# Patient Record
Sex: Female | Born: 1985 | Race: Black or African American | Hispanic: No | Marital: Single | Smoking: Current every day smoker
Health system: Southern US, Community
[De-identification: ages and names within clinical notes are randomized; demographics above are authoritative.]

## PROBLEM LIST (undated history)

## (undated) DIAGNOSIS — L732 Hidradenitis suppurativa: Secondary | ICD-10-CM

---

## 2004-06-09 ENCOUNTER — Emergency Department: Payer: Self-pay | Admitting: Emergency Medicine

## 2006-04-25 ENCOUNTER — Emergency Department: Payer: Self-pay | Admitting: Emergency Medicine

## 2006-11-23 ENCOUNTER — Emergency Department (HOSPITAL_COMMUNITY): Admission: EM | Admit: 2006-11-23 | Discharge: 2006-11-24 | Payer: Self-pay | Admitting: Emergency Medicine

## 2006-11-24 ENCOUNTER — Inpatient Hospital Stay (HOSPITAL_COMMUNITY): Admission: AD | Admit: 2006-11-24 | Discharge: 2006-11-24 | Payer: Self-pay | Admitting: Psychiatry

## 2006-11-24 ENCOUNTER — Ambulatory Visit: Payer: Self-pay | Admitting: Psychiatry

## 2007-02-02 ENCOUNTER — Emergency Department (HOSPITAL_COMMUNITY): Admission: EM | Admit: 2007-02-02 | Discharge: 2007-02-02 | Payer: Self-pay | Admitting: Emergency Medicine

## 2007-02-02 ENCOUNTER — Encounter (INDEPENDENT_AMBULATORY_CARE_PROVIDER_SITE_OTHER): Payer: Self-pay | Admitting: Emergency Medicine

## 2007-05-18 ENCOUNTER — Emergency Department (HOSPITAL_COMMUNITY): Admission: EM | Admit: 2007-05-18 | Discharge: 2007-05-18 | Payer: Self-pay | Admitting: Emergency Medicine

## 2007-09-18 ENCOUNTER — Ambulatory Visit: Payer: Self-pay | Admitting: Family Medicine

## 2007-10-14 ENCOUNTER — Emergency Department (HOSPITAL_COMMUNITY): Admission: EM | Admit: 2007-10-14 | Discharge: 2007-10-14 | Payer: Self-pay | Admitting: Emergency Medicine

## 2007-10-22 ENCOUNTER — Emergency Department: Payer: Self-pay | Admitting: Emergency Medicine

## 2008-03-14 ENCOUNTER — Observation Stay: Payer: Self-pay | Admitting: Obstetrics & Gynecology

## 2008-03-17 ENCOUNTER — Observation Stay: Payer: Self-pay

## 2008-03-19 ENCOUNTER — Inpatient Hospital Stay: Payer: Self-pay | Admitting: Obstetrics & Gynecology

## 2009-04-01 ENCOUNTER — Emergency Department: Payer: Self-pay | Admitting: Internal Medicine

## 2009-04-08 ENCOUNTER — Inpatient Hospital Stay: Payer: Self-pay | Admitting: Obstetrics and Gynecology

## 2010-04-20 ENCOUNTER — Encounter: Payer: Self-pay | Admitting: Obstetrics and Gynecology

## 2010-05-18 ENCOUNTER — Encounter: Payer: Self-pay | Admitting: Maternal & Fetal Medicine

## 2010-06-15 ENCOUNTER — Encounter: Payer: Self-pay | Admitting: Maternal & Fetal Medicine

## 2010-06-22 ENCOUNTER — Encounter: Payer: Self-pay | Admitting: Maternal and Fetal Medicine

## 2010-06-29 ENCOUNTER — Encounter: Payer: Self-pay | Admitting: Obstetrics & Gynecology

## 2010-07-06 ENCOUNTER — Encounter: Payer: Self-pay | Admitting: Maternal and Fetal Medicine

## 2010-07-07 ENCOUNTER — Ambulatory Visit: Payer: Self-pay

## 2010-07-11 ENCOUNTER — Inpatient Hospital Stay: Payer: Self-pay | Admitting: Obstetrics and Gynecology

## 2011-04-03 LAB — DIFFERENTIAL
Eosinophils Relative: 2
Lymphs Abs: 1.2
Monocytes Relative: 6
Neutro Abs: 5.5
Neutrophils Relative %: 75

## 2011-04-03 LAB — URINALYSIS, ROUTINE W REFLEX MICROSCOPIC
Hgb urine dipstick: NEGATIVE
Specific Gravity, Urine: 1.015
Urobilinogen, UA: 0.2

## 2011-04-03 LAB — BASIC METABOLIC PANEL
BUN: 3 — ABNORMAL LOW
CO2: 23
Calcium: 9.1
Chloride: 106
GFR calc Af Amer: >60
GFR calc non Af Amer: >60
Glucose, Bld: 86
Sodium: 134 — ABNORMAL LOW

## 2011-04-03 LAB — CBC: MCHC: 34.3

## 2011-04-17 LAB — DIFFERENTIAL
Basophils Absolute: 0
Basophils Relative: 1
Lymphocytes Relative: 24
Lymphs Abs: 1.6
Monocytes Absolute: 0.5
Monocytes Relative: 7

## 2011-04-17 LAB — CBC
HCT: 35.3 — ABNORMAL LOW
RBC: 4.52

## 2011-04-23 LAB — URINALYSIS, ROUTINE W REFLEX MICROSCOPIC
Hgb urine dipstick: NEGATIVE
Ketones, ur: NEGATIVE
Nitrite: NEGATIVE
Urobilinogen, UA: 2 — ABNORMAL HIGH

## 2011-04-23 LAB — CBC
HCT: 34.3 — ABNORMAL LOW
Hemoglobin: 11.4 — ABNORMAL LOW
MCHC: 33.4
MCV: 80.2
RBC: 4.28

## 2013-03-22 ENCOUNTER — Emergency Department: Payer: Self-pay | Admitting: Emergency Medicine

## 2013-03-22 LAB — COMPREHENSIVE METABOLIC PANEL
Albumin: 3.4 g/dL (ref 3.4–5.0)
BUN: 8 mg/dL (ref 7–18)
Chloride: 108 mmol/L — ABNORMAL HIGH (ref 98–107)
Creatinine: 0.76 mg/dL (ref 0.60–1.30)
EGFR (African American): >60
Glucose: 88 mg/dL (ref 65–99)
SGOT(AST): 20 U/L (ref 15–37)
SGPT (ALT): 22 U/L (ref 12–78)
Sodium: 139 mmol/L (ref 136–145)
Total Protein: 7.8 g/dL (ref 6.4–8.2)

## 2013-03-22 LAB — CBC
HCT: 37.6 % (ref 35.0–47.0)
MCH: 27.2 pg (ref 26.0–34.0)
MCHC: 33.6 g/dL (ref 32.0–36.0)
Platelet: 309 10*3/uL (ref 150–440)
RBC: 4.64 10*6/uL (ref 3.80–5.20)
RDW: 13.6 % (ref 11.5–14.5)

## 2013-03-22 LAB — URINALYSIS, COMPLETE
Ph: 6 (ref 4.5–8.0)
RBC,UR: 5 /HPF (ref 0–5)
Squamous Epithelial: 6

## 2013-03-22 LAB — LIPASE, BLOOD: Lipase: 99 U/L (ref 73–393)

## 2014-12-05 ENCOUNTER — Emergency Department: Payer: Self-pay

## 2014-12-05 ENCOUNTER — Encounter: Payer: Self-pay | Admitting: Occupational Medicine

## 2014-12-05 ENCOUNTER — Emergency Department
Admission: EM | Admit: 2014-12-05 | Discharge: 2014-12-05 | Disposition: A | Discharge status: Home / Self Care | Payer: Self-pay | Attending: Emergency Medicine | Admitting: Emergency Medicine

## 2014-12-05 DIAGNOSIS — Z720 Tobacco use: Secondary | ICD-10-CM | POA: Insufficient documentation

## 2014-12-05 DIAGNOSIS — I82402 Acute embolism and thrombosis of unspecified deep veins of left lower extremity: Secondary | ICD-10-CM | POA: Insufficient documentation

## 2014-12-05 MED ORDER — IBUPROFEN 600 MG PO TABS: 600.0000 mg | ORAL_TABLET | Freq: Four times a day (QID) | ORAL | Status: DC | PRN

## 2014-12-05 NOTE — Discharge Instructions (Signed)
Please seek medical attention for any high fevers, chest pain, shortness of breath, change in behavior, persistent vomiting, bloody stool or any other new or concerning symptoms. Muscle Cramps and Spasms Muscle cramps and spasms occur when a muscle or muscles tighten and you have no control over this tightening (involuntary muscle contraction). They are a common problem and can develop in any muscle. The most common place is in the calf muscles of the leg. Both muscle cramps and muscle spasms are involuntary muscle contractions, but they also have differences:   Muscle cramps are sporadic and painful. They may last a few seconds to a quarter of an hour. Muscle cramps are often more forceful and last longer than muscle spasms.  Muscle spasms may or may not be painful. They may also last just a few seconds or much longer. CAUSES  It is uncommon for cramps or spasms to be due to a serious underlying problem. In many cases, the cause of cramps or spasms is unknown. Some common causes are:   Overexertion.   Overuse from repetitive motions (doing the same thing over and over).   Remaining in a certain position for a long period of time.   Improper preparation, form, or technique while performing a sport or activity.   Dehydration.   Injury.   Side effects of some medicines.   Abnormally low levels of the salts and ions in your blood (electrolytes), especially potassium and calcium. This could happen if you are taking water pills (diuretics) or you are pregnant.  Some underlying medical problems can make it more likely to develop cramps or spasms. These include, but are not limited to:   Diabetes.   Parkinson disease.   Hormone disorders, such as thyroid problems.   Alcohol abuse.   Diseases specific to muscles, joints, and bones.   Blood vessel disease where not enough blood is getting to the muscles.  HOME CARE INSTRUCTIONS   Stay well hydrated. Drink enough water and  fluids to keep your urine clear or pale yellow.  It may be helpful to massage, stretch, and relax the affected muscle.  For tight or tense muscles, use a warm towel, heating pad, or hot shower water directed to the affected area.  If you are sore or have pain after a cramp or spasm, applying ice to the affected area may relieve discomfort.  Put ice in a plastic bag.  Place a towel between your skin and the bag.  Leave the ice on for 15-20 minutes, 03-04 times a day.  Medicines used to treat a known cause of cramps or spasms may help reduce their frequency or severity. Only take over-the-counter or prescription medicines as directed by your caregiver. SEEK MEDICAL CARE IF:  Your cramps or spasms get more severe, more frequent, or do not improve over time.  MAKE SURE YOU:   Understand these instructions.  Will watch your condition.  Will get help right away if you are not doing well or get worse. Document Released: 12/15/2001 Document Revised: 10/20/2012 Document Reviewed: 06/11/2012 [facility] Patient Information 2015 [employer] [place]. This information is not intended to replace advice given to you by your health care provider. Make sure you discuss any questions you have with your health care provider.

## 2014-12-05 NOTE — ED Notes (Signed)
Pt denies recent long periods of travel or SOB.

## 2014-12-05 NOTE — ED Provider Notes (Signed)
-----------------------------------------   8:13 AM on 12/05/2014 -----------------------------------------  Ultrasound venous imaging the lower unilateral left IMPRESSION: Negative for left lower extremity deep venous thrombosis.  Ultrasound results without any evidence of deep vein thrombosis. Will d/c home.   [facility]ineas SemenGraydon Zyair Rhein, MD 12/05/14 [phone]

## 2014-12-05 NOTE — ED Provider Notes (Signed)
[facility] Emergency Department Provider Note  ____________________________________________  Time seen: Approximately 530 AM  I have reviewed the triage vital signs and the nursing notes.   HISTORY  Chief Complaint Leg Pain    HPI Theresa Tanner is a 29 y.o. female who is a smoker who presents with 2 weeks of left calf pain which radiates to her left thigh. Patient says that it is relieved slightly by [facility] powder but the pain comes back quickly. It is worsened with movement. It is sharp and moderate. She was sent in today by relative who was concerned for a blood clot to the leg. The patient does not of any recent history of long car rides or plane trips. No family history of blood clots.denies any back pain or loss of bowel or bladder continence.   History reviewed. No pertinent past medical history.  There are no active problems to display for this patient.   History reviewed. No pertinent past surgical history.  No current outpatient prescriptions on file.  Allergies Review of patient's allergies indicates no known allergies.  History reviewed. No pertinent family history.  Social History History  Substance Use Topics  . Smoking status: Current Some Day Smoker    Types: Cigarettes  . Smokeless tobacco: Not on file  . Alcohol Use: Yes    Review of Systems Constitutional: No fever/chills Eyes: No visual changes. ENT: No sore throat. Cardiovascular: Denies chest pain. Respiratory: Denies shortness of breath. Gastrointestinal: No abdominal pain.  No nausea, no vomiting.  No diarrhea.  No constipation. Genitourinary: Negative for dysuria. Musculoskeletal: Negative for back pain. Skin: Negative for rash. Neurological: Negative for headaches, focal weakness or numbness. 10-point ROS otherwise negative.  ____________________________________________   PHYSICAL EXAM:  VITAL SIGNS: ED Triage Vitals  Enc Vitals Group     BP 12/05/14 0239  119/92 mmHg     Pulse Rate 12/05/14 0239 99     Resp 12/05/14 0239 18     Temp 12/05/14 0239 98.2 F (36.8 C)     Temp Source 12/05/14 0239 Oral     SpO2 12/05/14 0239 100 %     Weight 12/05/14 0239 241 lb (109.317 kg)     Height 12/05/14 0239  (1.6 m)     Head Cir --      Peak Flow --      Pain Score 12/05/14 0239 8     Pain Loc --      Pain Edu? --      Excl. in GC? --     Constitutional: Alert and oriented. Well appearing and in no acute distress. Eyes: Conjunctivae are normal. PERRL. EOMI. Head: Atraumatic. Nose: No congestion/rhinnorhea. Mouth/Throat: Mucous membranes are moist.  Oropharynx non-erythematous. Neck: No stridor.   Cardiovascular: Normal rate, regular rhythm. Grossly normal heart sounds.  Good peripheral circulation. Respiratory: Normal respiratory effort.  No retractions. Lungs CTAB. Gastrointestinal: Soft and nontender. No distention. No abdominal bruits. No CVA tenderness. Musculoskeletal: No lower extremity tenderness nor edema.  No joint effusions.no erythema or induration. Neurologic:  Normal speech and language. No gross focal neurologic deficits are appreciated. Speech is normal. No gait instability. Skin:  Skin is warm, dry and intact. No rash noted. Psychiatric: Mood and affect are normal. Speech and behavior are normal.  ____________________________________________   LABS (all labs ordered are listed, but only abnormal results are displayed)  Labs Reviewed - No data to display ____________________________________________  EKG   ____________________________________________  RADIOLOGY  Pending ultrasound Doppler  of the left lower extremity ____________________________________________   PROCEDURES    ____________________________________________   INITIAL IMPRESSION / ASSESSMENT AND PLAN / ED COURSE  Pertinent labs & imaging results that were available during my care of the patient were reviewed by me and considered in my  medical decision making (see chart for details).  ----------------------------------------- 7:33 AM on 12/05/2014 -----------------------------------------  Doctor given to follow-up with Doppler sound and disposition accordingly. ____________________________________________   FINAL CLINICAL IMPRESSION(S) / ED DIAGNOSES  Left leg pain. Acute, initial visit    Myrna Blazeravid Matthew Claris Guymon, MD 12/05/14 [phone]

## 2014-12-05 NOTE — ED Notes (Signed)
Pt presents to ED with c/o left leg pain that is constant travels all down the leg sharp pains sometimes goes numb. Pain  Is worse when sitting down. This started about 2 weeks ago BC powder helps slightly but comes right back.

## 2017-08-07 ENCOUNTER — Emergency Department
Admission: EM | Admit: 2017-08-07 | Discharge: 2017-08-07 | Disposition: A | Discharge status: Home / Self Care | Payer: Medicaid Other | Attending: Emergency Medicine | Admitting: Emergency Medicine

## 2017-08-07 ENCOUNTER — Other Ambulatory Visit: Payer: Self-pay

## 2017-08-07 DIAGNOSIS — F17210 Nicotine dependence, cigarettes, uncomplicated: Secondary | ICD-10-CM | POA: Diagnosis not present

## 2017-08-07 DIAGNOSIS — M5442 Lumbago with sciatica, left side: Secondary | ICD-10-CM | POA: Insufficient documentation

## 2017-08-07 DIAGNOSIS — M545 Low back pain: Secondary | ICD-10-CM | POA: Diagnosis present

## 2017-08-07 LAB — URINALYSIS, COMPLETE (UACMP) WITH MICROSCOPIC
Bacteria, UA: NONE SEEN
Bilirubin Urine: NEGATIVE
GLUCOSE, UA: NEGATIVE mg/dL
Hgb urine dipstick: NEGATIVE
KETONES UR: NEGATIVE mg/dL
Nitrite: NEGATIVE
PH: 7 (ref 5.0–8.0)
Protein, ur: NEGATIVE mg/dL
RBC / HPF: NONE SEEN RBC/hpf (ref 0–5)
Specific Gravity, Urine: 1.012 (ref 1.005–1.030)

## 2017-08-07 LAB — POCT PREGNANCY, URINE: Preg Test, Ur: NEGATIVE

## 2017-08-07 MED ORDER — PREDNISONE 10 MG PO TABS: ORAL_TABLET | ORAL | 0 refills | Status: DC

## 2017-08-07 MED ORDER — KETOROLAC TROMETHAMINE 30 MG/ML IJ SOLN
30.0000 mg | Freq: Once | INTRAMUSCULAR | Status: AC
  Administered 2017-08-07: 30 mg via INTRAMUSCULAR
  Filled 2017-08-07: qty 1

## 2017-08-07 MED ORDER — CYCLOBENZAPRINE HCL 5 MG PO TABS: 5.0000 mg | ORAL_TABLET | Freq: Three times a day (TID) | ORAL | 0 refills | Status: DC | PRN

## 2017-08-07 MED ORDER — CYCLOBENZAPRINE HCL 10 MG PO TABS
10.0000 mg | ORAL_TABLET | Freq: Once | ORAL | Status: AC
Start: 2017-08-07 — End: 2017-08-07
  Administered 2017-08-07: 10 mg via ORAL
  Filled 2017-08-07: qty 1

## 2017-08-07 NOTE — ED Triage Notes (Signed)
Pt c/o lower back pain that radiates into the left leg since yesterday

## 2017-08-07 NOTE — ED Notes (Signed)
Pt resting in bed, spouse at bedside, appears in no distress.

## 2017-08-07 NOTE — Discharge Instructions (Signed)
Follow-up with your [facility] provider at [facility] if any continued problems.  Begin taking Flexeril 5 mg 3 times daily for muscle spasms as needed.  Begin taking prednisone 6 tablets today and tapering down by 1 tablet for the next 6 days.  Moist heat or ice to your back as needed.  Also if lying down placed 2 pillows underneath your knees to elevate which will help with your back.  Urinalysis today is negative.

## 2017-08-07 NOTE — ED Provider Notes (Signed)
[facility] Emergency Department Provider Note  ___________________________________________   First MD Initiated Contact with Patient 08/07/17 [phone]     (approximate)  I have reviewed the triage vital signs and the nursing notes.   HISTORY  Chief Complaint Back Pain  HPI Theresa Tanner is a 32 y.o. female is here with complaint of low back pain that started yesterday with radiation into her left leg stopping near her knee.  Patient states that she has not had any recent back injury.  She states she has had some urinary frequency but no burning.  She denies any fever, nausea, vomiting or diarrhea.  She has not take any over-the-counter medication for her back.  She denies any incontinence of bowel or bladder.  She rates her pain as a 10/10.   History reviewed. No pertinent past medical history.  There are no active problems to display for this patient.   History reviewed. No pertinent surgical history.  Prior to Admission medications   Medication Sig Start Date End Date Taking? Authorizing Provider  cyclobenzaprine (FLEXERIL) 5 MG tablet Take 1 tablet (5 mg total) by mouth 3 (three) times daily as needed for muscle spasms. 08/07/17   Tommi RumpsSummers, Casandra Dallaire L, PA-C  predniSONE (DELTASONE) 10 MG tablet Take 6 tablets  today, on day 2 take 5 tablets, day 3 take 4 tablets, day 4 take 3 tablets, day 5 take  2 tablets and 1 tablet the last day 08/07/17   Tommi RumpsSummers, Yanelie Abraha L, PA-C    Allergies Patient has no known allergies.  No family history on file.  Social History Social History   Tobacco Use  . Smoking status: Current Some Day Smoker    Types: Cigarettes  . Smokeless tobacco: Never Used  Substance Use Topics  . Alcohol use: Yes  . Drug use: No    Review of Systems Constitutional: No fever/chills Cardiovascular: Denies chest pain. Respiratory: Denies shortness of breath. Gastrointestinal: No abdominal pain.  No nausea, no vomiting.  Genitourinary:  Positive for urinary frequency. Musculoskeletal: Positive for low back pain with left leg radiculopathy. Skin: Negative for rash. Neurological: Negative for headaches, focal weakness or numbness. ___________________________________________   PHYSICAL EXAM:  VITAL SIGNS: ED Triage Vitals  Enc Vitals Group     BP 08/07/17 0741 117/66     Pulse Rate 08/07/17 0741 70     Resp 08/07/17 0740 17     Temp 08/07/17 0740 97.6 F (36.4 C)     Temp Source 08/07/17 0740 Oral     SpO2 08/07/17 0741 98 %     Weight 08/07/17 0741 269 lb (122 kg)     Height 08/07/17 0741 5\' 3"  (1.6 m)     Head Circumference --      Peak Flow --      Pain Score 08/07/17 0740 10     Pain Loc --      Pain Edu? --      Excl. in GC? --    Constitutional: Alert and oriented. Well appearing and in no acute distress.  Obese.  Lying on stomach. Eyes: Conjunctivae are normal. Head: Atraumatic. Neck: No stridor.   Cardiovascular: Normal rate, regular rhythm. Grossly normal heart sounds.  Good peripheral circulation. Respiratory: Normal respiratory effort.  No retractions. Lungs CTAB. Gastrointestinal: Soft and nontender. No distention.  No CVA tenderness. Musculoskeletal: Examination of the back there is no gross deformity noted.  There is some minimal tenderness on palpation of L5-S1 area and left SI joint  area and surrounding tissue.  Range of motion is guarded secondary to discomfort.  No active muscle spasms were seen.  Limited standing and walking secondary to pain. Neurologic:  Normal speech and language. No gross focal neurologic deficits are appreciated.  Skin:  Skin is warm, dry and intact. No rash noted. Psychiatric: Mood and affect are normal. Speech and behavior are normal.  ____________________________________________   LABS (all labs ordered are listed, but only abnormal results are displayed)  Labs Reviewed  URINALYSIS, COMPLETE (UACMP) WITH MICROSCOPIC - Abnormal; Notable for the following  components:      Result Value   Color, Urine YELLOW (*)    APPearance HAZY (*)    Leukocytes, UA MODERATE (*)    Squamous Epithelial / LPF 0-5 (*)    All other components within normal limits  POC URINE PREG, ED  POCT PREGNANCY, URINE    PROCEDURES  Procedure(s) performed: None  Procedures  Critical Care performed: No  ____________________________________________   INITIAL IMPRESSION / ASSESSMENT AND PLAN / ED COURSE Patient was given Flexeril 10 mg p.o. and Toradol 30 mg IM while in the department.  Patient was getting some relief prior to discharge.  She was made aware that her was not confirmed for a UTI.  Patient has left-sided sciatica with her low back pain.  She was placed on prednisone 60 mg 6-day taper along with Flexeril 5 mg 1 3 times daily as needed for muscle spasms for the next 4 days.  She is encouraged to use ice or heat to her back and also use pillows to elevate her knees when lying flat.  Patient will follow up with her PCP if any continued problems. ____________________________________________   FINAL CLINICAL IMPRESSION(S) / ED DIAGNOSES  Final diagnoses:  Acute left-sided low back pain with left-sided sciatica     ED Discharge Orders        Ordered    cyclobenzaprine (FLEXERIL) 5 MG tablet  3 times daily PRN     08/07/17 0850    predniSONE (DELTASONE) 10 MG tablet     08/07/17 0850       Note:  This document was prepared using Dragon voice recognition software and may include unintentional dictation errors.    Tommi Rumps, PA-C 08/07/17 1040    Emily Filbert, MD 08/07/17 [phone]

## 2018-07-08 DIAGNOSIS — L732 Hidradenitis suppurativa: Secondary | ICD-10-CM | POA: Insufficient documentation

## 2018-07-08 LAB — HM HIV SCREENING LAB: HM HIV Screening: NEGATIVE

## 2018-07-08 LAB — HM PAP SMEAR

## 2019-06-09 ENCOUNTER — Telehealth: Payer: Self-pay

## 2019-06-09 NOTE — Telephone Encounter (Signed)
TC to patient.  Informed patient she needs appt for pap.  States just started new job and doesn't want to ask off until January 2021. Patient verbalized understanding that she is responsible for calling and scheduling appt in January 2021 for pap.   Pap 07/08/2018 Negative; HPV + Aileen Fass, RN

## 2019-07-09 DIAGNOSIS — L732 Hidradenitis suppurativa: Secondary | ICD-10-CM

## 2019-07-13 ENCOUNTER — Ambulatory Visit: Payer: Self-pay

## 2019-07-13 ENCOUNTER — Encounter: Payer: Self-pay | Admitting: Family Medicine

## 2019-07-13 ENCOUNTER — Other Ambulatory Visit: Payer: Self-pay

## 2019-07-13 ENCOUNTER — Ambulatory Visit (LOCAL_COMMUNITY_HEALTH_CENTER): Payer: Self-pay | Admitting: Family Medicine

## 2019-07-13 VITALS — BP 108/71 | Ht 63.0 in | Wt 282.8 lb

## 2019-07-13 DIAGNOSIS — Z113 Encounter for screening for infections with a predominantly sexual mode of transmission: Secondary | ICD-10-CM

## 2019-07-13 DIAGNOSIS — Z30013 Encounter for initial prescription of injectable contraceptive: Secondary | ICD-10-CM

## 2019-07-13 DIAGNOSIS — G479 Sleep disorder, unspecified: Secondary | ICD-10-CM

## 2019-07-13 DIAGNOSIS — N611 Abscess of the breast and nipple: Secondary | ICD-10-CM

## 2019-07-13 DIAGNOSIS — Z3009 Encounter for other general counseling and advice on contraception: Secondary | ICD-10-CM

## 2019-07-13 LAB — WET PREP FOR TRICH, YEAST, CLUE
Trichomonas Exam: NEGATIVE
Yeast Exam: NEGATIVE

## 2019-07-13 LAB — PREGNANCY, URINE: Preg Test, Ur: NEGATIVE

## 2019-07-13 MED ORDER — MEDROXYPROGESTERONE ACETATE 150 MG/ML IM SUSP
150.0000 mg | INTRAMUSCULAR | Status: AC
  Administered 2019-07-13: 12:00:00 150 mg via INTRAMUSCULAR

## 2019-07-13 MED ORDER — THERA VITAL M PO TABS: 1.0000 | ORAL_TABLET | Freq: Every day | ORAL | 0 refills | Status: AC

## 2019-07-13 NOTE — Progress Notes (Signed)
PT and wet mount negative, no treatment indicated. Depo given, left arm, tolerated well, next Depo card given. Quit line card given as well.Burt Knack, RN

## 2019-07-13 NOTE — Progress Notes (Signed)
Patient here for annual exam and states needs Pap. Last PE and pap 07/08/2018. 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 No periods, states she has a nexplanon that she states was supposed to be removed in 2017 or 2018, but provider couldn't find it. She states she was referred to [facility] for removal, but never went. Still has nexplanon.Burt Knack, RN

## 2019-07-13 NOTE — Progress Notes (Signed)
Family Planning Visit- Initial Visit  Subjective:  Theresa Tanner is a 34 y.o. being seen today for an initial well woman visit and to discuss family planning options.  She is currently using condoms sometimes for pregnancy prevention. Patient reports she does not want a pregnancy in the next year.  Patient has the following medical conditions has Hidradenitis suppurativa and Morbid obesity (HCC) on their problem list.  Chief Complaint  Patient presents with  . Gynecologic Exam    Here today for yearly physical, contraception, pap and STI testing.   Last pap 2019, hpv +, normal cytology. Due today  She would like to restart depo. She started this last year but stopped d/t not wanting to come to clinic d/t Covid. Last sex was last night with condom, though has had sex w/out condom in last 2 wks. LMP was several years ago, hasn't had since Nexplanon inserted. Nexplanon is still in place though was due to come out 1-2 yrs ago - unable to palpate in arm and pt was referred to [facility] for u/s though didn't go d/t transportation.   Pt denies all of the following, which are contraindications to Depo use: Known breast cancer Pregnancy Also denies: Hypertension (CDC cat 2 if mild, cat 3 if severe) Severe cirrhosis, hepatocellular adenoma Diabetes with nephrosis or vascular complications Ischemic heart disease or multiple risk factors for atherosclerotic disease, and some forms of lupus Unexplained vaginal bleeding Pregnancy planned within the next year Long-term use of corticosteroid therapy in women with a history of, or risk factors for, nontraumatic (frailty) fractures.  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 Father's mother had breast cancer.   Has been  having trouble sleeping, endorses stress.  Body mass index is 50.1 kg/m. - Patient is eligible for diabetes screening based on BMI and age >38?  no HA1C ordered? not applicable  Patient reports 1 of partners in last year. Desires STI screening?  Yes - pt requests  Does the patient have a current or past history of drug use? No   No components found for: HCV]   Health Maintenance Due  Topic Date Due  . TETANUS/TDAP  12/29/2002  . INFLUENZA VACCINE  02/07/2019  . PAP SMEAR-Modifier  07/09/2019    ROS  The following portions of the patient's history were reviewed and updated as appropriate: allergies, current medications, past family history, past medical history, past social history, past surgical history and problem list. Problem list updated.   See flowsheet for other program required questions.  Objective:   Vitals:   07/13/19 1005  BP: 108/71  Weight: 282 lb 12.8 oz (128.3 kg)  Height: 5\' 3"  (1.6 m)    Physical Exam Vitals and nursing note reviewed.  Constitutional:      Appearance: Normal appearance.     Comments: Affect appropriate. Pt converses easily.  HENT:     Head: Normocephalic and atraumatic.     Mouth/Throat:     Mouth: Mucous membranes are moist.     Pharynx: Oropharynx is clear. No oropharyngeal exudate or posterior oropharyngeal erythema.  Eyes:     Conjunctiva/sclera: Conjunctivae normal.  Neck:     Thyroid: No thyroid mass, thyromegaly or thyroid tenderness.  Cardiovascular:     Rate and Rhythm: Normal rate and  regular rhythm.     Pulses: Normal pulses.     Heart sounds: Normal heart sounds.  Pulmonary:     Effort: Pulmonary effort is normal.     Breath sounds: Normal breath sounds.  Chest:     Breasts:        Right: Normal. No swelling, bleeding, inverted nipple, mass, nipple discharge, skin change or tenderness.        Left: No swelling, bleeding, inverted nipple, nipple discharge, skin change or tenderness.     Comments: ~3-4 small  (<.5cm) nodules and fluctuance at 6:00 base of L breast. Nontender.  Abdominal:     General: Abdomen is flat.     Palpations: There is no mass.     Tenderness: There is no abdominal tenderness. There is no rebound.  Genitourinary:    General: Normal vulva.     Exam position: Lithotomy position.     Pubic Area: No rash or pubic lice.      Labia:        Right: No rash or lesion.        Left: No rash or lesion.      Vagina: Normal. No vaginal discharge, erythema, bleeding or lesions.     Cervix: No cervical motion tenderness, discharge, friability, lesion or erythema.     Uterus: Normal.      Adnexa: Right adnexa normal and left adnexa normal.     Rectum: Normal.  Musculoskeletal:     Left upper arm: Normal.     Comments: Unable to palpate Nexplanon.  Lymphadenopathy:     Head:     Right side of head: No preauricular or posterior auricular adenopathy.     Left side of head: No preauricular or posterior auricular adenopathy.     Cervical: No cervical adenopathy.     Upper Body:     Right upper body: No supraclavicular or axillary adenopathy.     Left upper body: No supraclavicular or axillary adenopathy.     Lower Body: No right inguinal adenopathy. No left inguinal adenopathy.  Skin:    General: Skin is warm and dry.     Findings: No rash.  Neurological:     Mental Status: She is alert and oriented to person, place, and time.       Assessment and Plan:  Theresa Tanner is a 34 y.o. female presenting to the [facility] Department for an initial well woman exam/family planning visit  Contraception counseling: Reviewed all forms of birth control options in the tiered based approach. available including abstinence; over the counter/barrier methods; hormonal contraceptive medication including pill, patch, ring, injection,contraceptive implant; hormonal and nonhormonal IUDs; permanent sterilization options including vasectomy and the various tubal sterilization modalities.  Risks, benefits, and typical effectiveness rates were reviewed.  Questions were answered.  Written information was also given to the patient to review.  Patient desires depo, this was prescribed for patient. She will follow up in  3 months for surveillance.  She was told to call with any further questions, or with any concerns about this method of contraception.  Emphasized use of condoms 100% of the time for STI prevention.  Patient was offered ECP: n/a - no unprotected sex w/in 5 days  Patient desires depo, this was prescribed for patient. She will follow up in  3 months for surveillance.  Patient was counseled on the side effect of the method chosen, the correct use of her desired method and how to discontinue in the future.  She was told to call with any further questions, or with any concerns about this method of contraception.  Emphasized use of condoms 100% of the time for STI prevention.   1. Family planning services -Pt needs Nexplanon removed though unable to palpate in clinic. She has been referred to [facility], though unable to get there, prefers [facility]. Referral placed for Xray of L upper arm at [facility], once we know location will assess if we can remove in clinic or if pt needs to be referred to ob/gyn for u/s guided removal.  -Pap today in follow up to HPV+, normal cytology in 2019. - Pregnancy, urine - IGP, Aptima HPV - Multiple Vitamins-Minerals (MULTIVITAMIN) tablet; Take 1 tablet by mouth daily.  Dispense: 100 tablet; Refill: 0 - DG Humerus Left  2. Initiation of Depo Provera -We discussed that since pt has had unprotected sex 7 days ago, LMP several years ago she is at risk of pregnancy even though urine preg test is negative today. Discussed risks and benefits of following options: Depo today + home preg test in 7 days or wait 7 days, RTC for preg test + Depo. She verbalizes understanding of risks and prefers to get Depo today, take home preg test in 7 days.  -Pt to use backup contraception  for 2 weeks. Recommended condoms 100% of time for STI protection. - medroxyPROGESTERone (DEPO-PROVERA) injection 150 mg  3. Screening examination for venereal disease -Pt requests screening. Screenings today as below. Treat wet prep per standing order. -Patient does not meet criteria for HepB, HepC Screening.  -Counseled on warning s/sx and when to seek care. Recommended condom use with all sex and discussed importance of condom use for STI prevention. - WET PREP FOR [place], YEAST, [place] [facility] - Syphilis Serology, [place] [facility]  4. Sleep difficulties -Phq-9 score 15 today. Pt endorses stress and subsequent extreme inability to sleep. Offered beh health appt for counseling and community resources, pt agrees. Also given PCP list, advised to reinitiate with PCP asap for further workup of insomnia. No si/hi today, advised to go to ER if present. - Ambulatory referral to [place]  5. Breast abscess of female -Exam today with small nodularities and fluctuance in L breast, pt with known hx of breast abscesses in this area that come and go. PCP list given, advised to be seen by PCP, continue to monitor and if any mass does not resolve to be seen again for further investigation.    Return in about 3 months (around 10/11/2019) for Depo.  No future appointments.  Kandee Keen, PA-C

## 2019-07-15 ENCOUNTER — Encounter: Payer: Self-pay | Admitting: Family Medicine

## 2019-07-15 LAB — IGP, APTIMA HPV
HPV Aptima: NEGATIVE
PAP Smear Comment: 0

## 2019-07-16 ENCOUNTER — Encounter: Payer: Self-pay | Admitting: Family Medicine

## 2019-07-16 DIAGNOSIS — Z8742 Personal history of other diseases of the female genital tract: Secondary | ICD-10-CM | POA: Insufficient documentation

## 2019-08-06 ENCOUNTER — Ambulatory Visit: Payer: Self-pay | Admitting: Licensed Clinical Social Worker

## 2020-07-19 ENCOUNTER — Telehealth: Payer: Self-pay

## 2020-07-19 NOTE — Telephone Encounter (Signed)
Telephone call to patient today regarding the need for a repeat PAP and physical due 07/2020. Phone is not accepting calls at this time. Hart Carwin, RN

## 2020-08-17 NOTE — Telephone Encounter (Signed)
Telephone call to patient today regarding the need for a repeat Pap and physical due 07/2020.  Appointment scheduled for 08/24/2020 at 9:20 am (arrival time 8:50 am).  Hart Carwin, RN

## 2020-08-24 ENCOUNTER — Ambulatory Visit: Payer: Self-pay

## 2020-08-26 ENCOUNTER — Ambulatory Visit (LOCAL_COMMUNITY_HEALTH_CENTER): Payer: Self-pay | Admitting: Family Medicine

## 2020-08-26 ENCOUNTER — Encounter: Payer: Self-pay | Admitting: Advanced Practice Midwife

## 2020-08-26 ENCOUNTER — Other Ambulatory Visit: Payer: Self-pay

## 2020-08-26 VITALS — BP 130/84 | Ht 63.0 in | Wt 301.6 lb

## 2020-08-26 DIAGNOSIS — N912 Amenorrhea, unspecified: Secondary | ICD-10-CM

## 2020-08-26 DIAGNOSIS — Z3200 Encounter for pregnancy test, result unknown: Secondary | ICD-10-CM

## 2020-08-26 DIAGNOSIS — Z8742 Personal history of other diseases of the female genital tract: Secondary | ICD-10-CM

## 2020-08-26 DIAGNOSIS — Z01419 Encounter for gynecological examination (general) (routine) without abnormal findings: Secondary | ICD-10-CM

## 2020-08-26 DIAGNOSIS — L732 Hidradenitis suppurativa: Secondary | ICD-10-CM

## 2020-08-26 DIAGNOSIS — Z1331 Encounter for screening for depression: Secondary | ICD-10-CM

## 2020-08-26 DIAGNOSIS — Z975 Presence of (intrauterine) contraceptive device: Secondary | ICD-10-CM | POA: Insufficient documentation

## 2020-08-26 LAB — PREGNANCY, URINE: Preg Test, Ur: NEGATIVE

## 2020-08-26 MED ORDER — MEDROXYPROGESTERONE ACETATE 10 MG PO TABS: 10.0000 mg | ORAL_TABLET | Freq: Every day | ORAL | 0 refills | Status: AC

## 2020-08-26 NOTE — Progress Notes (Addendum)
Family Planning Visit- Repeat Yearly Visit  Subjective:  Theresa Tanner is a 35 y.o. [phone]  being seen today for an well woman visit and to discuss family planning options.    She is currently using Condoms for pregnancy prevention. Patient reports she does not want a pregnancy in the next year. Patient  has Hidradenitis suppurativa; Morbid obesity (HCC); History of abnormal cervical Pap smear; and Nexplanon in place on their problem list.  Chief Complaint  Patient presents with  . Annual Exam  . Breast Problem    Patient reports   Breast/underarm: has continued lesions under arms and breasts. Occasionally red/irritated/pus. Currently sx under left arm.   Nexplanon: did not follow up with [facility] or [facility] for imaging. Thinks device was placed in left upper arm. Theresa Tanner placed in 2012. Has had amenorrhea since placement. Received occasional depo throughout this time. Uses condoms with all sex.   Amenorrhea-- present since nexplanon placed in 2012.  Patient denies changes in medical history   See flowsheet for other program required questions.   Body mass index is 53.43 kg/m. - Patient is eligible for diabetes screening based on BMI and age >110?  no HA1C ordered? no  Patient reports 1 of partners in last year. Desires STI screening?  Yes   Has patient been screened once for HCV in the past?  No  No results found for: HCVAB  Does the patient have current of drug use, have a partner with drug use, and/or has been incarcerated since last result? No  If yes-- Screen for HCV through [facility]   Does the patient meet criteria for HBV testing? No  Criteria:  -Household, sexual or needle sharing contact with HBV -History of drug use -HIV positive -Those with known Hep C   Health Maintenance Due  Topic Date Due  . Hepatitis C Screening  Never done  . COVID-19 Vaccine (1) Never done  . TETANUS/TDAP  Never done  . INFLUENZA VACCINE  Never done  . PAP SMEAR-Modifier   07/12/2020    ROS  The following portions of the patient's history were reviewed and updated as appropriate: allergies, current medications, past family history, past medical history, past social history, past surgical history and problem list. Problem list updated.  Objective:   Vitals:   08/26/20 1122  BP: 130/84  Weight: (!) 301 lb 9.6 oz (136.8 kg)  Height: 5\' 3"  (1.6 m)    Physical Exam Constitutional:      Appearance: Normal appearance.  HENT:     Head: Normocephalic and atraumatic.  Neck:     Thyroid: No thyromegaly.     Comments: Acathosis nigrans present Pulmonary:     Effort: Pulmonary effort is normal.  Chest:  Breasts:     Tanner Score is 5.     Right: Skin change (Scarring inferiorlly ) present. No axillary adenopathy or supraclavicular adenopathy.     Left: Skin change (scarring inferior to breast) present. No axillary adenopathy or supraclavicular adenopathy.        Comments: Besides skin changes, breast is normal Abdominal:     Palpations: Abdomen is soft.  Genitourinary:    Exam position: Lithotomy position.     Pubic Area: No rash.      Tanner stage (genital): 5.     Labia:        Right: No rash or tenderness.        Left: No rash or tenderness.      Vagina: Normal.  Cervix: Normal.     Uterus: Normal.      Adnexa: Right adnexa normal and left adnexa normal.    Musculoskeletal:        General: Normal range of motion.  Lymphadenopathy:     Upper Body:     Right upper body: No supraclavicular or axillary adenopathy.     Left upper body: No supraclavicular or axillary adenopathy.  Skin:    General: Skin is warm and dry.  Neurological:     General: No focal deficit present.     Mental Status: She is alert.  Psychiatric:        Mood and Affect: Mood normal.        Behavior: Behavior normal.       Assessment and Plan:  Theresa Tanner is a 35 y.o. female [phone] presenting to the [facility] Department for an yearly well  woman exam/family planning visit  Contraception counseling: Reviewed all forms of birth control options in the tiered based approach. available including abstinence; over the counter/barrier methods; hormonal contraceptive medication including pill, patch, ring, injection,contraceptive implant, ECP; hormonal and nonhormonal IUDs; permanent sterilization options including vasectomy and the various tubal sterilization modalities. Risks, benefits, and typical effectiveness rates were reviewed.  Questions were answered.  Written information was also given to the patient to review.  Patient desires condoms-- declines hormonal contraception.  She was told to call with any further questions, or with any concerns about this method of contraception.  Emphasized use of condoms 100% of the time for STI prevention.  ECP not indicated  1. Possible pregnancy, not confirmed - UPT negative today - Pregnancy, urine  2. History of abnormal cervical Pap smear 2019-- NIL with positive HPV, then in 2021 was NIL with neg HPV. Will repeat today if normal return to routine screening - IGP, Aptima HPV  3. Hidradenitis suppurativa Counseled on shaving, use of antibiotic soap Recommended PCP follow up  4. Morbid obesity (HCC) Contributes to HS and concern for amenorrhea (possible PCOS)  5. Well woman exam with routine gynecological exam CBE today Pap today Counseled on weight loss - HIV [place] [facility] - Syphilis Serology, [place] [facility] - Chlamydia/GC NAA, Confirmation  6. Nexplanon in place Will order XR of left to locate device and route to [facility] coordinator for follow up Counseled that nexplanon is NOT preventing pregnancy at this point and she needs to use condoms with all sex. She declines other contraception today. - DG Shoulder Left; Future  7. Amenorrhea Discussed provera challenge given no period in > 10 years Instructed to take 10 days of pills and expect bleeding in the next 10 days. If no bleeding she  was instructed to report this to her PCP at her visit 09/06/20 - medroxyPROGESTERone (PROVERA) 10 MG tablet; Take 1 tablet (10 mg total) by mouth daily. Use for ten days  Dispense: 10 tablet; Refill: 0  The patient was dispensed Provera today. I provided counseling today regarding the medication. We discussed the medication, the side effects and when to call clinic. Patient given the opportunity to ask questions. Questions answered.   8. Positive depression screening Having stressors with housing- is about to lose her section 8 housing Encouraged her to stop by DSS to help with housing support - Ambulatory referral to Behavioral Health  9. Tobacco Recommended cessation but this was the focus of the visit today   Return in about 1 year (around 08/26/2021) for Yearly wellness exam.  No future appointments.  Caren Macadam, MD

## 2020-08-26 NOTE — Progress Notes (Signed)
Patient here for PE and needs repeat Pap. States she still has Nexplanon in her arm and this was addressed at last PE in 07/13/2019. Patient states she could not go to [facility] for follow-up on lost Nexplanon and wants to do it in [place]. Patient states not having periods, sent to lab for PT per provider orders.Burt Knack, RN

## 2020-08-28 ENCOUNTER — Encounter: Payer: Self-pay | Admitting: Family Medicine

## 2020-08-28 NOTE — Progress Notes (Signed)
Chart reviewed by Pharmacist  Suzanne Walker PharmD, Contract Pharmacist at [facility]  

## 2020-08-30 LAB — IGP, APTIMA HPV
HPV Aptima: NEGATIVE
PAP Smear Comment: 0

## 2020-09-05 ENCOUNTER — Telehealth: Payer: Self-pay

## 2020-09-05 NOTE — Telephone Encounter (Signed)
Call to patient. Received message that caller can not accept calls at this time. Patient needs to go to [facility] for left arm x-ray to check for nexplanon placement. Does not need appt. Will send this message to patient via mychart.   Harvie Heck, RN

## 2020-09-06 ENCOUNTER — Ambulatory Visit
Admission: RE | Admit: 2020-09-06 | Discharge: 2020-09-06 | Disposition: A | Discharge status: Home / Self Care | Payer: Self-pay | Source: Ambulatory Visit | Attending: Family Medicine | Admitting: Family Medicine

## 2020-09-06 ENCOUNTER — Ambulatory Visit
Admission: RE | Admit: 2020-09-06 | Discharge: 2020-09-06 | Disposition: A | Discharge status: Home / Self Care | Payer: Self-pay | Attending: Family Medicine | Admitting: Family Medicine

## 2020-09-06 DIAGNOSIS — Z975 Presence of (intrauterine) contraceptive device: Secondary | ICD-10-CM

## 2020-09-13 ENCOUNTER — Telehealth: Payer: Self-pay

## 2020-09-13 NOTE — Telephone Encounter (Signed)
Call to patient. RN informed patient that K.Newton MD reviewed results and recommends nexplanon be removed with US guidance. Patient does not have insurance and will prefer [facility] referral.   Harvie Heck, RN

## 2020-09-15 NOTE — Telephone Encounter (Signed)
Just to clarify-- did you complete this referral?

## 2020-09-29 ENCOUNTER — Ambulatory Visit: Payer: Self-pay

## 2020-10-11 ENCOUNTER — Telehealth: Payer: Self-pay | Admitting: Family Medicine

## 2021-01-28 ENCOUNTER — Emergency Department
Admission: EM | Admit: 2021-01-28 | Discharge: 2021-01-28 | Disposition: A | Discharge status: Home / Self Care | Payer: No Typology Code available for payment source | Attending: Emergency Medicine | Admitting: Emergency Medicine

## 2021-01-28 ENCOUNTER — Encounter: Payer: Self-pay | Admitting: Emergency Medicine

## 2021-01-28 ENCOUNTER — Emergency Department: Payer: No Typology Code available for payment source

## 2021-01-28 ENCOUNTER — Other Ambulatory Visit: Payer: Self-pay

## 2021-01-28 DIAGNOSIS — F17210 Nicotine dependence, cigarettes, uncomplicated: Secondary | ICD-10-CM | POA: Insufficient documentation

## 2021-01-28 DIAGNOSIS — K2900 Acute gastritis without bleeding: Secondary | ICD-10-CM | POA: Diagnosis not present

## 2021-01-28 DIAGNOSIS — N898 Other specified noninflammatory disorders of vagina: Secondary | ICD-10-CM | POA: Diagnosis not present

## 2021-01-28 DIAGNOSIS — R1010 Upper abdominal pain, unspecified: Secondary | ICD-10-CM | POA: Diagnosis present

## 2021-01-28 DIAGNOSIS — Z20822 Contact with and (suspected) exposure to covid-19: Secondary | ICD-10-CM | POA: Insufficient documentation

## 2021-01-28 LAB — COMPREHENSIVE METABOLIC PANEL
ALT: 18 U/L (ref 0–44)
AST: 17 U/L (ref 15–41)
Albumin: 3.5 g/dL (ref 3.5–5.0)
Alkaline Phosphatase: 80 U/L (ref 38–126)
Anion gap: 7 (ref 5–15)
BUN: 9 mg/dL (ref 6–20)
CO2: 24 mmol/L (ref 22–32)
Calcium: 8.8 mg/dL — ABNORMAL LOW (ref 8.9–10.3)
Chloride: 104 mmol/L (ref 98–111)
Creatinine, Ser: 0.64 mg/dL (ref 0.44–1.00)
GFR, Estimated: >60 mL/min (ref >60)
Glucose, Bld: 123 mg/dL — ABNORMAL HIGH (ref 70–99)
Potassium: 3.8 mmol/L (ref 3.5–5.1)
Sodium: 135 mmol/L (ref 135–145)
Total Bilirubin: 0.6 mg/dL (ref 0.3–1.2)
Total Protein: 7.4 g/dL (ref 6.5–8.1)

## 2021-01-28 LAB — CBC
HCT: 40.9 % (ref 36.0–46.0)
Hemoglobin: 13.2 g/dL (ref 12.0–15.0)
MCH: 27.7 pg (ref 26.0–34.0)
MCHC: 32.3 g/dL (ref 30.0–36.0)
MCV: 85.7 fL (ref 80.0–100.0)
Platelets: 436 10*3/uL — ABNORMAL HIGH (ref 150–400)
RBC: 4.77 MIL/uL (ref 3.87–5.11)
RDW: 13.9 % (ref 11.5–15.5)
WBC: 10.4 10*3/uL (ref 4.0–10.5)
nRBC: 0 % (ref 0.0–0.2)

## 2021-01-28 LAB — RESP PANEL BY RT-PCR (FLU A&B, COVID) ARPGX2
Influenza A by PCR: NEGATIVE
Influenza B by PCR: NEGATIVE
SARS Coronavirus 2 by RT PCR: NEGATIVE

## 2021-01-28 LAB — URINALYSIS, COMPLETE (UACMP) WITH MICROSCOPIC
Bilirubin Urine: NEGATIVE
Glucose, UA: NEGATIVE mg/dL
Hgb urine dipstick: NEGATIVE
Ketones, ur: 80 mg/dL — AB
Leukocytes,Ua: NEGATIVE
Nitrite: NEGATIVE
Protein, ur: NEGATIVE mg/dL
Specific Gravity, Urine: 1.02 (ref 1.005–1.030)
pH: 8 (ref 5.0–8.0)

## 2021-01-28 LAB — HCG, QUANTITATIVE, PREGNANCY: hCG, Beta Chain, Quant, S: <1 m[IU]/mL (ref <5)

## 2021-01-28 LAB — LIPASE, BLOOD: Lipase: 23 U/L (ref 11–51)

## 2021-01-28 MED ORDER — DICYCLOMINE HCL 10 MG PO CAPS: 10.0000 mg | ORAL_CAPSULE | Freq: Four times a day (QID) | ORAL | 0 refills | Status: AC

## 2021-01-28 MED ORDER — ONDANSETRON 4 MG PO TBDP: 4.0000 mg | ORAL_TABLET | Freq: Three times a day (TID) | ORAL | 0 refills | Status: AC | PRN

## 2021-01-28 MED ORDER — LACTATED RINGERS IV BOLUS
1000.0000 mL | Freq: Once | INTRAVENOUS | Status: AC
  Administered 2021-01-28: 1000 mL via INTRAVENOUS

## 2021-01-28 MED ORDER — DICYCLOMINE HCL 10 MG PO CAPS
10.0000 mg | ORAL_CAPSULE | Freq: Once | ORAL | Status: AC
  Administered 2021-01-28: 10 mg via ORAL
  Filled 2021-01-28: qty 1

## 2021-01-28 MED ORDER — PANTOPRAZOLE SODIUM 40 MG IV SOLR
40.0000 mg | Freq: Once | INTRAVENOUS | Status: AC
  Administered 2021-01-28: 40 mg via INTRAVENOUS
  Filled 2021-01-28: qty 40

## 2021-01-28 MED ORDER — ONDANSETRON HCL 4 MG/2ML IJ SOLN
4.0000 mg | Freq: Once | INTRAMUSCULAR | Status: AC
  Administered 2021-01-28: 4 mg via INTRAVENOUS
  Filled 2021-01-28: qty 2

## 2021-01-28 MED ORDER — IOHEXOL 350 MG/ML SOLN
100.0000 mL | Freq: Once | INTRAVENOUS | Status: AC | PRN
  Administered 2021-01-28: 100 mL via INTRAVENOUS
  Filled 2021-01-28: qty 100

## 2021-01-28 NOTE — ED Triage Notes (Signed)
Pt reports that she woke up this am with epigastric pain and nausea. Pt reports vomited x's 1 as well. Pt denies pain that radiates or SOB

## 2021-01-28 NOTE — ED Provider Notes (Signed)
[facility] Emergency Department Provider Note  ____________________________________________   Event Date/Time   First MD Initiated Contact with Patient 01/28/21 1244     (approximate)  I have reviewed the triage vital signs and the nursing notes.   HISTORY  Chief Complaint Abdominal Pain   HPI Theresa Tanner is a 35 y.o. female with a past medical history of obesity and C-section who presents accompanied by her mother for assessment of some nonbloody emesis vomiting and upper abdominal pain that began yesterday.  Patient denies any diarrhea, blood in her stool or melanotic stools, burning with urination, abnormal vaginal bleeding or discharge, chest pain, cough, shortness of breath, headache or earache, sore throat, rash or other acute sick symptoms.  She denies significant NSAID use or EtOH use.  Denies any other acute concerns at this time.         History reviewed. No pertinent past medical history.  Patient Active Problem List   Diagnosis Date Noted   History of abnormal cervical Pap smear 07/16/2019   Hidradenitis suppurativa 07/08/2018   Morbid obesity (HCC) 07/08/2018    Past Surgical History:  Procedure Laterality Date   CESAREAN SECTION  2012    Prior to Admission medications   Medication Sig Start Date End Date Taking? Authorizing Provider  dicyclomine (BENTYL) 10 MG capsule Take 1 capsule (10 mg total) by mouth 4 (four) times daily for 3 days. 01/28/21 01/31/21 Yes Gilles Chiquito, MD  ondansetron (ZOFRAN ODT) 4 MG disintegrating tablet Take 1 tablet (4 mg total) by mouth every 8 (eight) hours as needed for nausea or vomiting. 01/28/21  Yes Gilles Chiquito, MD  medroxyPROGESTERone (PROVERA) 10 MG tablet Take 1 tablet (10 mg total) by mouth daily. Use for ten days 08/26/20   Federico Flake, MD  Multiple Vitamins-Minerals (MULTIVITAMIN) tablet Take 1 tablet by mouth daily. Patient not taking: Reported on 08/26/2020 07/13/19   Federico Flake, MD    Allergies Patient has no known allergies.  Family History  Problem Relation Age of Onset   Breast cancer Paternal Grandmother    Heart disease Paternal Grandmother    Lymphoma Other    Cancer Mother    Cancer Sister     Social History Social History   Tobacco Use   Smoking status: Every Day    Packs/day: 1.00    Years: 10.00    Pack years: 10.00    Types: Cigarettes   Smokeless tobacco: Never  Vaping Use   Vaping Use: Never used  Substance Use Topics   Alcohol use: Yes    Comment: occ   Drug use: No    Review of Systems  Review of Systems  Constitutional:  Negative for chills and fever.  HENT:  Negative for sore throat.   Eyes:  Negative for pain.  Respiratory:  Negative for cough and stridor.   Cardiovascular:  Negative for chest pain.  Gastrointestinal:  Positive for abdominal pain, nausea and vomiting.  Genitourinary:  Negative for dysuria.  Musculoskeletal:  Negative for myalgias.  Skin:  Negative for rash.  Neurological:  Negative for seizures, loss of consciousness and headaches.  Psychiatric/Behavioral:  Negative for suicidal ideas.   All other systems reviewed and are negative.    ____________________________________________   PHYSICAL EXAM:  VITAL SIGNS: ED Triage Vitals  Enc Vitals Group     BP 01/28/21 0935 119/80     Pulse Rate 01/28/21 0935 62     Resp 01/28/21 0935 20  Temp 01/28/21 0935 97.9 F (36.6 C)     Temp Source 01/28/21 0935 Oral     SpO2 01/28/21 0935 99 %     Weight 01/28/21 0937 299 lb 13.2 oz (136 kg)     Height 01/28/21 0937 5\' 3"  (1.6 m)     Head Circumference --      Peak Flow --      Pain Score 01/28/21 0937 10     Pain Loc --      Pain Edu? --      Excl. in GC? --    Vitals:   01/28/21 1530 01/28/21 1532  BP: 114/81   Pulse: (!) 57 66  Resp: 16   Temp:    SpO2: 99% 99%   Physical Exam Vitals and nursing note reviewed.  Constitutional:      General: She is in acute distress.      Appearance: She is well-developed. She is ill-appearing.  HENT:     Head: Normocephalic and atraumatic.     Right Ear: External ear normal.     Left Ear: External ear normal.     Nose: Nose normal.  Eyes:     Conjunctiva/sclera: Conjunctivae normal.  Cardiovascular:     Rate and Rhythm: Normal rate and regular rhythm.     Heart sounds: No murmur heard. Pulmonary:     Effort: Pulmonary effort is normal. No respiratory distress.     Breath sounds: Normal breath sounds.  Abdominal:     Palpations: Abdomen is soft.     Tenderness: There is abdominal tenderness in the epigastric area and left upper quadrant. There is no right CVA tenderness or left CVA tenderness.  Musculoskeletal:     Cervical back: Neck supple.  Skin:    General: Skin is warm and dry.     Capillary Refill: Capillary refill takes less than 2 seconds.  Neurological:     Mental Status: She is alert and oriented to person, place, and time.  Psychiatric:        Mood and Affect: Mood normal.     ____________________________________________   LABS (all labs ordered are listed, but only abnormal results are displayed)  Labs Reviewed  COMPREHENSIVE METABOLIC PANEL - Abnormal; Notable for the following components:      Result Value   Glucose, Bld 123 (*)    Calcium 8.8 (*)    All other components within normal limits  CBC - Abnormal; Notable for the following components:   Platelets 436 (*)    All other components within normal limits  URINALYSIS, COMPLETE (UACMP) WITH MICROSCOPIC - Abnormal; Notable for the following components:   Color, Urine YELLOW (*)    APPearance CLEAR (*)    Ketones, ur 80 (*)    Bacteria, UA RARE (*)    All other components within normal limits  RESP PANEL BY RT-PCR (FLU A&B, COVID) ARPGX2  LIPASE, BLOOD  HCG, QUANTITATIVE, PREGNANCY   ____________________________________________  EKG  Sinus bradycardia with ventricular to 58, normal axis, unremarkable intervals without clearance  of acute ischemia or significant arrhythmia ____________________________________________  RADIOLOGY  ED MD interpretation: CT abdomen pelvis remarkable for cholelithiasis without evidence of cholecystitis, kidney stone, diverticulitis, pancreatitis, SBO, appendicitis, or other clear acute abdominopelvic pathology.  Official radiology report(s): CT ABDOMEN PELVIS W CONTRAST  Result Date: 01/28/2021 CLINICAL DATA:  Epigastric abdominal pain, nausea and vomiting. EXAM: CT ABDOMEN AND PELVIS WITH CONTRAST TECHNIQUE: Multidetector CT imaging of the abdomen and pelvis was performed using the standard  protocol following bolus administration of intravenous contrast. CONTRAST:  100mL OMNIPAQUE IOHEXOL 350 MG/ML SOLN COMPARISON:  None. FINDINGS: Lower chest: No acute abnormality. Hepatobiliary: Cholelithiasis with vaguely marginated dependent calculi in the gallbladder. The gallbladder is elongated in appearance but does not show evidence of surrounding acute inflammation. There is no evidence biliary ductal dilatation or choledocholithiasis by CT. The liver appears unremarkable. Pancreas: Unremarkable. No pancreatic ductal dilatation or surrounding inflammatory changes. Spleen: Normal in size without focal abnormality. Adrenals/Urinary Tract: Adrenal glands are unremarkable. Kidneys are normal, without renal calculi, focal lesion, or hydronephrosis. Bladder is unremarkable. Stomach/Bowel: Bowel shows no evidence of obstruction, ileus, inflammation or perforation. No free air identified. Vascular/Lymphatic: No significant vascular findings are present. No enlarged abdominal or pelvic lymph nodes. Reproductive: Uterus and bilateral adnexa are unremarkable. Other: No abdominal wall hernia or abnormality. No abdominopelvic ascites. Musculoskeletal: No acute or significant osseous findings. IMPRESSION: Cholelithiasis with gallstones visualized in the dependent gallbladder. No evidence by CT of acute cholecystitis or  biliary obstruction. Electronically Signed   By: Irish LackGlenn  Yamagata M.D.   On: 01/28/2021 15:14    ____________________________________________   PROCEDURES  Procedure(s) performed (including Critical Care):  Procedures   ____________________________________________   INITIAL IMPRESSION / ASSESSMENT AND PLAN / ED COURSE      Patient presents with above-stated history exam for assessment of some epigastric abdominal pain associate with nonbloody emesis vomiting that started late last night/early this morning.  On arrival she is hypertensive with some epigastric and left upper quadrant tenderness.  Differential includes peptic ulcer disease, acute infectious gastritis, cholecystitis, pancreatitis, SBO, diverticulitis and cystitis.  CT abdomen pelvis remarkable for cholelithiasis without evidence of cholecystitis, kidney stone, diverticulitis, pancreatitis, SBO, appendicitis, or other clear acute abdominopelvic pathology.  CBC without leukocytosis or acute anemia.  CMP without significant lecture of metabolic derangements.  hCG is negative.  COVID influenza swab is negative.  Lipase not consistent with acute pancreatitis.  UA not suggestive of acute cystitis.  On reassessment patient states her nausea is much better she still has little bit of crampy upper abdominal pain.  On reassessment of her abdomen she does not have any focal tenderness in right upper quadrant while she does have some stones but on CT I have a low suspicion for acute cholecystitis and more concerned about acute infectious gastritis.  On my reassessment patient states she is feeling better and is able tolerate some p.o.  Given stable vitals with lites reassuring exam work-up patient now tolerating p.o. intake she is stable for discharge with close outpatient follow-up.  Will write Rx for Bentyl and Zofran.  Advise close outpatient PCP follow-up.  Discharged stable condition.  Strict return precautions advised and  discussed.     ____________________________________________   FINAL CLINICAL IMPRESSION(S) / ED DIAGNOSES  Final diagnoses:  Acute gastritis without hemorrhage, unspecified gastritis type    Medications  ondansetron (ZOFRAN) injection 4 mg (4 mg Intravenous Given 01/28/21 1317)  lactated ringers bolus 1,000 mL (0 mLs Intravenous Stopped 01/28/21 1528)  pantoprazole (PROTONIX) injection 40 mg (40 mg Intravenous Given 01/28/21 1317)  iohexol (OMNIPAQUE) 350 MG/ML injection 100 mL (100 mLs Intravenous Contrast Given 01/28/21 1501)  dicyclomine (BENTYL) capsule 10 mg (10 mg Oral Given 01/28/21 1531)     ED Discharge Orders          Ordered    ondansetron (ZOFRAN ODT) 4 MG disintegrating tablet  Every 8 hours PRN        01/28/21 1536    dicyclomine (BENTYL)  10 MG capsule  4 times daily        01/28/21 1536             Note:  This document was prepared using Dragon voice recognition software and may include unintentional dictation errors.    Gilles Chiquito, MD 01/28/21 1537

## 2022-10-29 IMAGING — CR DG SHOULDER 2+V*L*
1 series · 3 of 3 positions shown · non-contrast
Comparison: None.

CLINICAL DATA: Lost Nexplanon.

EXAM:
LEFT SHOULDER - 2+ VIEW

[Series 1: dg shoulder left · 0.14mm/px · 3 of 3 slices shown]
[im 1/3]
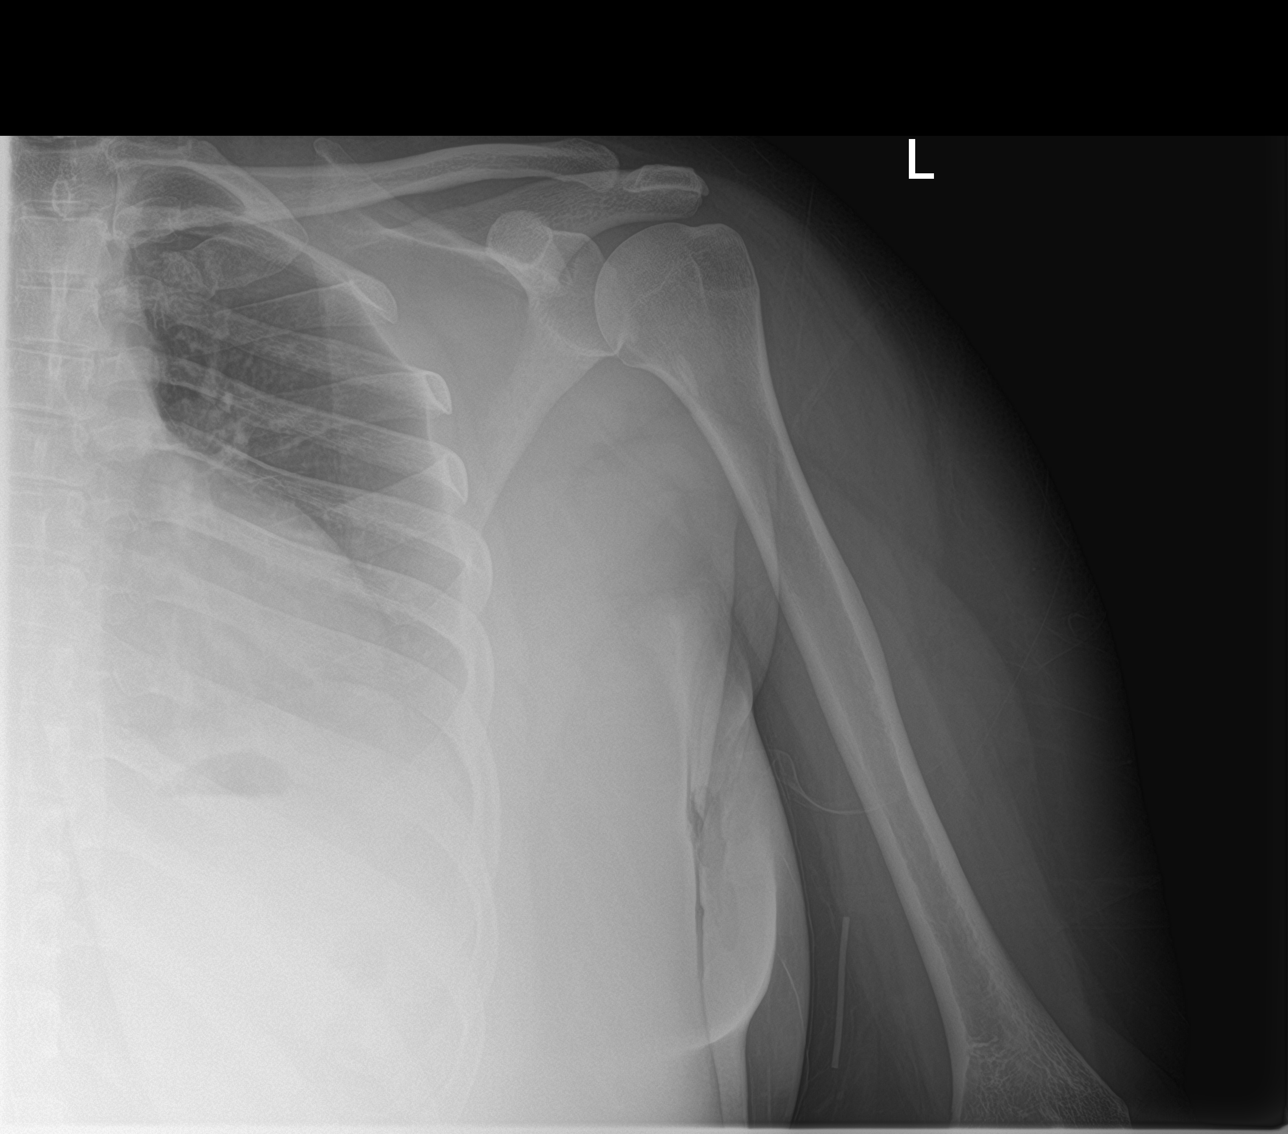
[im 2/3]
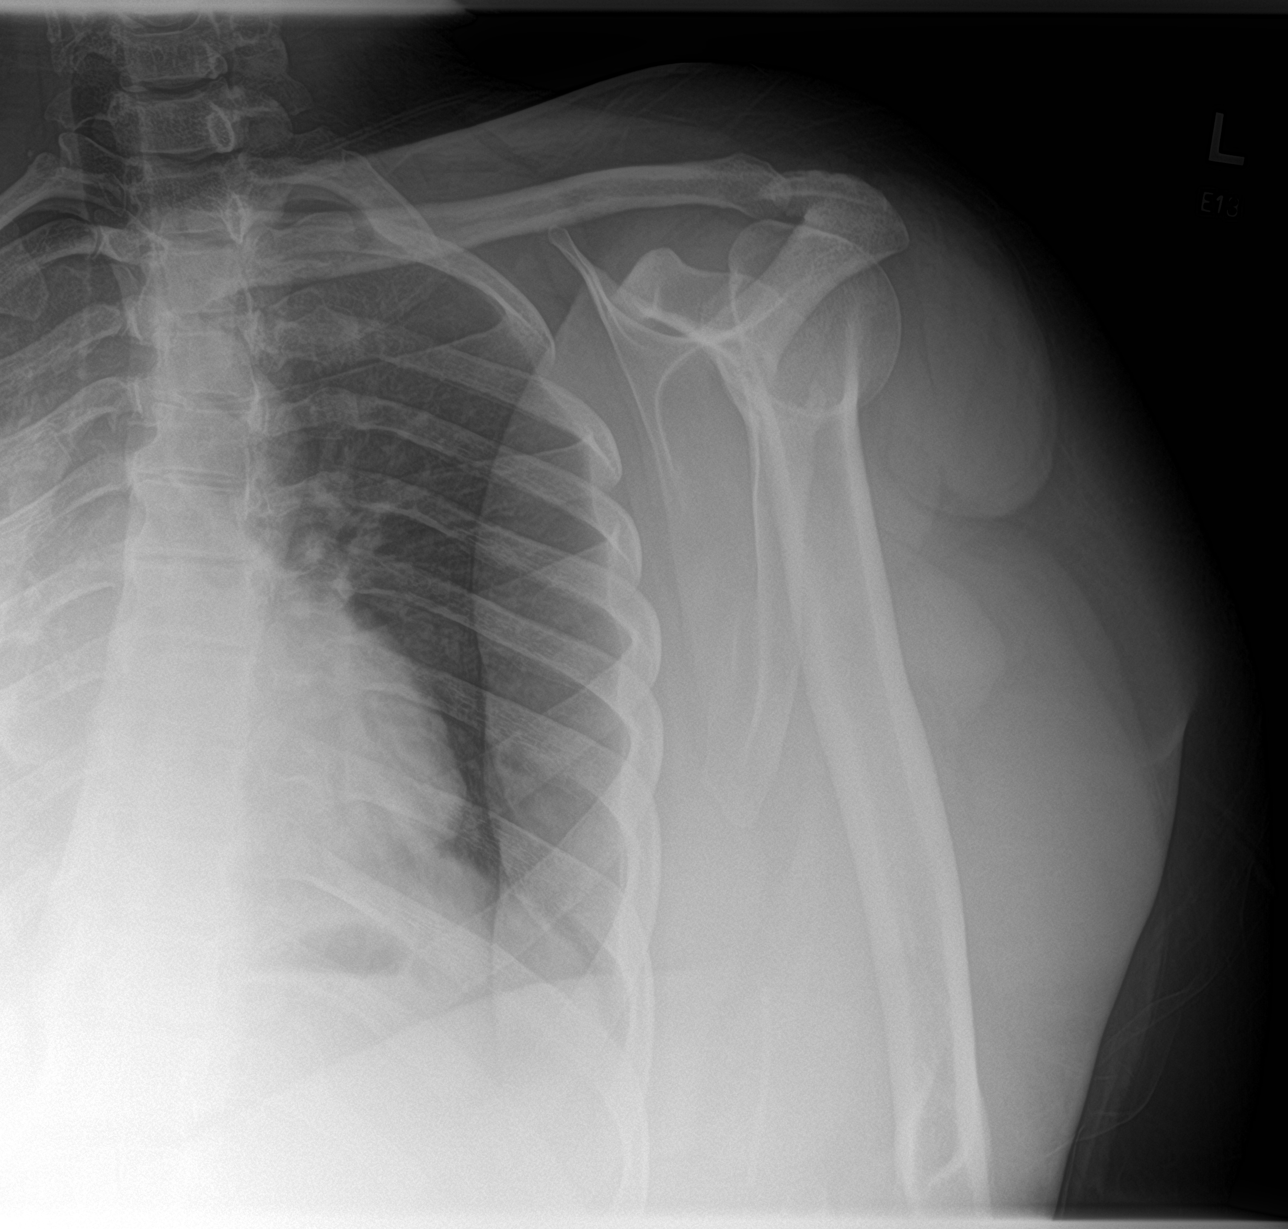
[im 3/3]
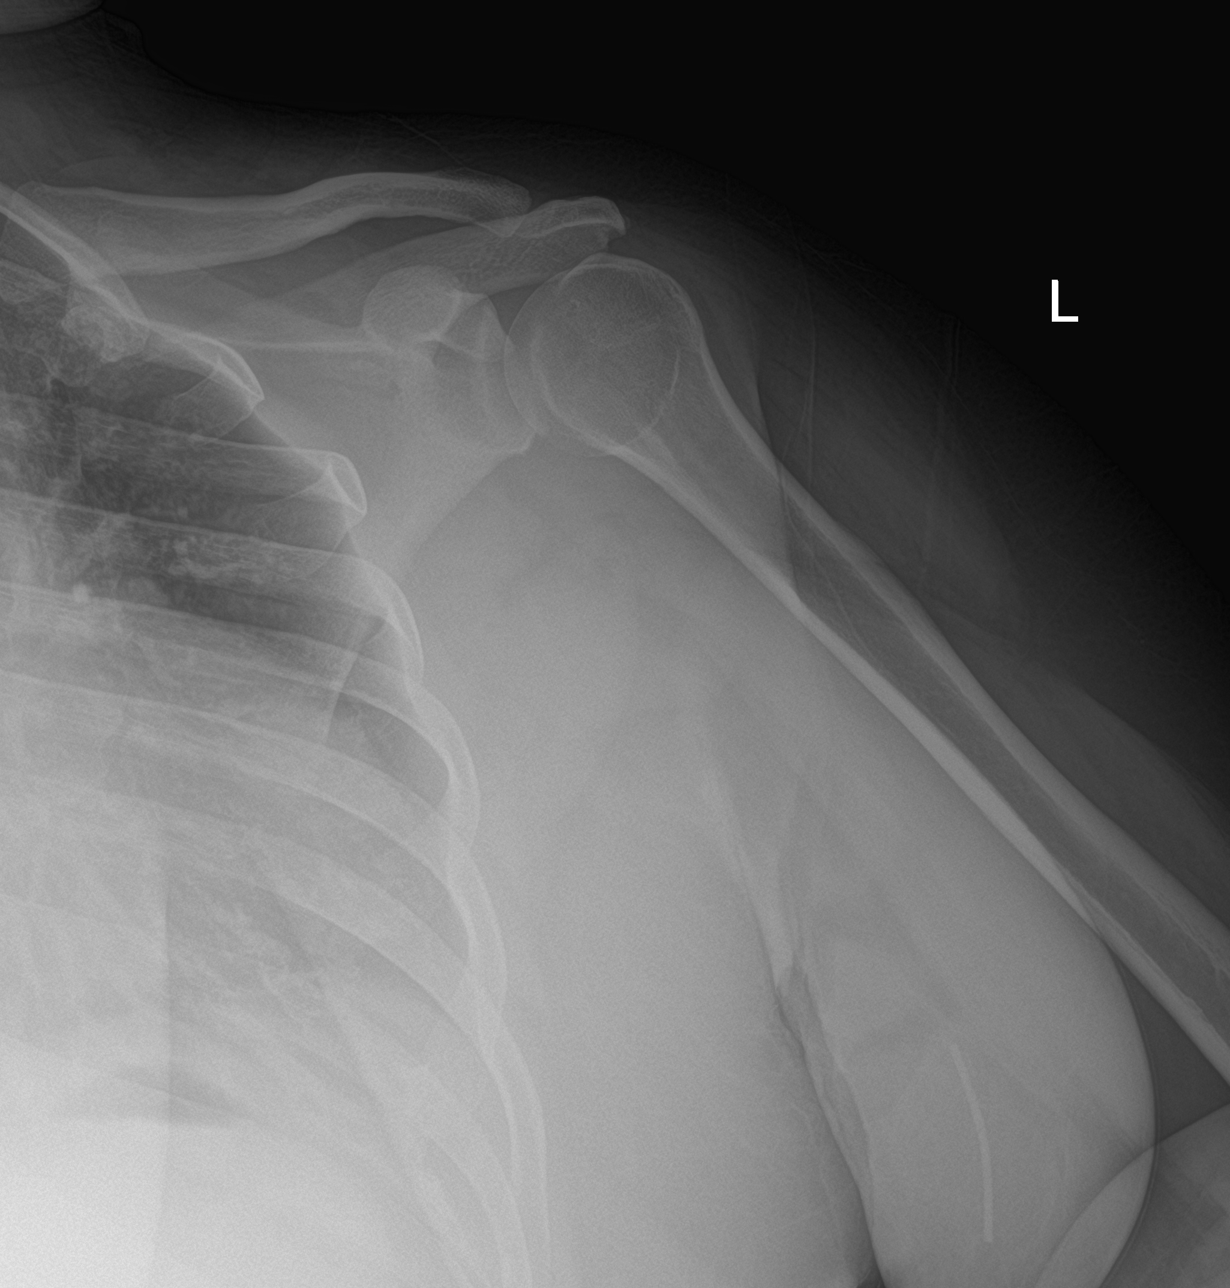

[3 of 3 positions shown; findings below may reference images not displayed]

FINDINGS: No acute fracture or dislocation. Joint spaces and alignment are
maintained. No area of erosion or osseous destruction. Linear
radiopaque subcutaneous device in the mid inner upper arm soft
tissues. Soft tissues are otherwise unremarkable.
IMPRESSION: Linear radiopaque subcutaneous device in the mid inner arm soft
tissues consistent with Nexplanon.

## 2023-05-15 ENCOUNTER — Emergency Department
Admission: EM | Admit: 2023-05-15 | Discharge: 2023-05-15 | Disposition: A | Discharge status: Home / Self Care | Payer: Commercial Managed Care - PPO | Attending: Emergency Medicine | Admitting: Emergency Medicine

## 2023-05-15 ENCOUNTER — Encounter: Payer: Self-pay | Admitting: Intensive Care

## 2023-05-15 ENCOUNTER — Other Ambulatory Visit: Payer: Self-pay

## 2023-05-15 DIAGNOSIS — S161XXA Strain of muscle, fascia and tendon at neck level, initial encounter: Secondary | ICD-10-CM | POA: Insufficient documentation

## 2023-05-15 DIAGNOSIS — X58XXXA Exposure to other specified factors, initial encounter: Secondary | ICD-10-CM | POA: Diagnosis not present

## 2023-05-15 DIAGNOSIS — M62838 Other muscle spasm: Secondary | ICD-10-CM | POA: Insufficient documentation

## 2023-05-15 DIAGNOSIS — S199XXA Unspecified injury of neck, initial encounter: Secondary | ICD-10-CM | POA: Diagnosis present

## 2023-05-15 HISTORY — DX: Hidradenitis suppurativa: L73.2

## 2023-05-15 MED ORDER — METHOCARBAMOL 750 MG PO TABS: 750.0000 mg | ORAL_TABLET | Freq: Four times a day (QID) | ORAL | 0 refills | Status: AC

## 2023-05-15 MED ORDER — MUSCLE RUB 10-15 % EX CREA: 1.0000 | TOPICAL_CREAM | CUTANEOUS | 0 refills | Status: AC | PRN

## 2023-05-15 NOTE — ED Triage Notes (Signed)
Patient c/o bilateral neck pain that radiates down shoulders and reports left sided jaw pain that started this AM

## 2023-05-15 NOTE — ED Provider Notes (Signed)
[facility] Provider Note    Event Date/Time   First MD Initiated Contact with Patient 05/15/23 [phone]     (approximate)   History   Neck Pain and Jaw Pain   HPI  Theresa Tanner is a 37 y.o. female   Past medical history of otherwise healthy young woman who presents to the emergency department with neck pain.  She think she might of slept on it wrong couple days ago and has some aching/soreness to the left side of her neck that radiates down to the trapezius muscle.  She denies any respiratory symptoms.  She denies any fever.  No motor or sensory changes.     Physical Exam   Triage Vital Signs: ED Triage Vitals  Encounter Vitals Group     BP 05/15/23 0857 116/71     Systolic BP Percentile --      Diastolic BP Percentile --      Pulse Rate 05/15/23 0857 90     Resp 05/15/23 0857 18     Temp 05/15/23 0857 98.7 F (37.1 C)     Temp Source 05/15/23 0857 Oral     SpO2 05/15/23 0857 99 %     Weight 05/15/23 0858 268 lb (121.6 kg)     Height 05/15/23 0858 5\' 3"  (1.6 m)     Head Circumference --      Peak Flow --      Pain Score 05/15/23 0857 7     Pain Loc --      Pain Education --      Exclude from Growth Chart --     Most recent vital signs: Vitals:   05/15/23 1000 05/15/23 1030  BP: (!) 130/91 129/88  Pulse: 74 78  Resp: (!) 22 11  Temp:    SpO2: 96% 97%    General: Awake, no distress.  CV:  Good peripheral perfusion.  Resp:  Normal effort.  Abd:  No distention.  Other:  Comfortable appearing, neck is supple with full range of motion.  Oropharynx appears normal.  No cervical lymphadenopathy.  Soreness to the left paraspinal cervical area as well as the trapezius which feels tight.  Normal hemodynamics.   ED Results / Procedures / Treatments   Labs (all labs ordered are listed, but only abnormal results are displayed) Labs Reviewed - No data to display  PROCEDURES:  Critical Care performed: No  Procedures   MEDICATIONS  ORDERED IN ED: Medications - No data to display  IMPRESSION / MDM / ASSESSMENT AND PLAN / ED COURSE  I reviewed the triage vital signs and the nursing notes.                                Patient's presentation is most consistent with acute presentation with potential threat to life or bodily function.  Differential diagnosis includes, but is not limited to, cervical strain, MSK pain, muscle spasm, considered but less likely meningitis, fractures dislocations, dissection   The patient is on the cardiac monitor to evaluate for evidence of arrhythmia and/or significant heart rate changes.  MDM:    Well-appearing patient with symptoms and exam most consistent with musculoskeletal pain or cervical strain or trapezius spasm.  Given unremarkable exam and no associated infectious symptoms or neurologic symptoms I doubt this is meningitis, dissection, or other life-threatening emergency at this time.  Anticipatory guidance given she will follow-up with PMD.  FINAL CLINICAL IMPRESSION(S) / ED DIAGNOSES   Final diagnoses:  Acute strain of neck muscle, initial encounter  Trapezius muscle spasm     Rx / DC Orders   ED Discharge Orders          Ordered    Menthol-Methyl Salicylate (MUSCLE RUB) 10-15 % CREA  As needed        05/15/23 1028    methocarbamol (ROBAXIN-750) 750 MG tablet  4 times daily        05/15/23 1028             Note:  This document was prepared using Dragon voice recognition software and may include unintentional dictation errors.    Pilar Jarvis, MD 05/15/23 1054

## 2023-05-15 NOTE — Discharge Instructions (Addendum)
Take acetaminophen 650 mg and ibuprofen 400 mg every 6 hours for pain.  Take with food.  If you decide to take the Robaxin muscle relaxant please note that this medication may make you sleepy so please be aware when you take it not to drive or use machinery  Thank you for choosing [place] for your health care today!  Please see your primary doctor this week for a follow up appointment.   If you have any new, worsening, or unexpected symptoms call your doctor right away or come back to the emergency department for reevaluation.  It was my pleasure to care for you today.   Daneil Dan Modesto Charon, MD

## 2024-03-24 ENCOUNTER — Other Ambulatory Visit: Payer: Self-pay | Admitting: Family Medicine

## 2024-03-24 DIAGNOSIS — Z1231 Encounter for screening mammogram for malignant neoplasm of breast: Secondary | ICD-10-CM

## 2024-04-20 ENCOUNTER — Ambulatory Visit
Admission: RE | Admit: 2024-04-20 | Discharge: 2024-04-20 | Disposition: A | Discharge status: Home / Self Care | Source: Ambulatory Visit | Attending: Family Medicine | Admitting: Family Medicine

## 2024-04-20 DIAGNOSIS — Z1231 Encounter for screening mammogram for malignant neoplasm of breast: Secondary | ICD-10-CM | POA: Insufficient documentation

## 2024-04-27 ENCOUNTER — Ambulatory Visit: Payer: Self-pay

## 2024-04-27 DIAGNOSIS — Z23 Encounter for immunization: Secondary | ICD-10-CM | POA: Diagnosis not present

## 2024-04-27 DIAGNOSIS — Z719 Counseling, unspecified: Secondary | ICD-10-CM

## 2024-04-27 NOTE — Progress Notes (Signed)
 In nurse clinic for immunizations Hep B and MMR. Tolerated vaccines well. VIS given and copies of [facility]. Pt advised when to return for 2nd dose of Heplisave.
